# Patient Record
Sex: Male | Born: 2000 | Race: White | Hispanic: No | Marital: Single
Health system: Southern US, Community
[De-identification: ages and names within clinical notes are randomized; demographics above are authoritative.]

---

## 2018-10-03 DIAGNOSIS — Z23 Encounter for immunization: Secondary | ICD-10-CM | POA: Diagnosis not present

## 2019-01-14 DIAGNOSIS — Z029 Encounter for administrative examinations, unspecified: Secondary | ICD-10-CM | POA: Diagnosis not present

## 2019-01-14 DIAGNOSIS — J4520 Mild intermittent asthma, uncomplicated: Secondary | ICD-10-CM | POA: Diagnosis not present

## 2019-01-15 DIAGNOSIS — H9190 Unspecified hearing loss, unspecified ear: Secondary | ICD-10-CM | POA: Diagnosis not present

## 2019-01-15 DIAGNOSIS — H532 Diplopia: Secondary | ICD-10-CM | POA: Diagnosis not present

## 2019-01-15 DIAGNOSIS — J4520 Mild intermittent asthma, uncomplicated: Secondary | ICD-10-CM | POA: Diagnosis not present

## 2019-01-15 DIAGNOSIS — Z0000 Encounter for general adult medical examination without abnormal findings: Secondary | ICD-10-CM | POA: Diagnosis not present

## 2019-03-20 DIAGNOSIS — H00024 Hordeolum internum left upper eyelid: Secondary | ICD-10-CM | POA: Diagnosis not present

## 2019-03-29 ENCOUNTER — Ambulatory Visit: Payer: BC Managed Care – PPO | Attending: Internal Medicine

## 2019-03-29 DIAGNOSIS — Z23 Encounter for immunization: Secondary | ICD-10-CM

## 2019-03-29 NOTE — Progress Notes (Signed)
   Cov[facility] Vaccination [facility]  Name:  Arham Symmonds    MRN: [id number] DOB: 29-Sep-2000  03/29/2019  Mr. Rowland was observed post Covid-19 immunization for 15 minutes without incident. He was provided with Vaccine Information Sheet and instruction to access the V-Safe system.   Mr. Sluka was instructed to call 911 with any severe reactions post vaccine: Marland Kitchen Difficulty breathing  . Swelling of face and throat  . A fast heartbeat  . A bad rash all over body  . Dizziness and weakness   Immunizations Administered    Name Date Dose VIS Date Route   Pfizer COVID-19 Vaccine 03/29/2019  4:25 PM 0.3 mL 12/13/2018 Intramuscular   Manufacturer: [employer], [employer]   Lot: [id number]   NDC: [address]-1

## 2019-04-03 DIAGNOSIS — H0015 Chalazion left lower eyelid: Secondary | ICD-10-CM | POA: Diagnosis not present

## 2019-04-22 ENCOUNTER — Ambulatory Visit: Payer: BC Managed Care – PPO | Attending: Internal Medicine

## 2019-04-22 DIAGNOSIS — Z23 Encounter for immunization: Secondary | ICD-10-CM

## 2019-04-22 NOTE — Progress Notes (Signed)
   Covid-19 Vaccination Clinic  Name:  Joshua Sheppard    MRN: [id number] DOB: 10-13-2000  04/22/2019  [name] was observed post Covid-19 immunization for 30 minutes based on pre-vaccination screening wit dad who has to wait fo 30 mins without incident. He was provided with Vaccine Information Sheet and instruction to access the V-Safe system.   [name] was instructed to call 911 with any severe reactions post vaccine: Marland Kitchen Difficulty breathing  . Swelling of face and throat  . A fast heartbeat  . A bad rash all over body  . Dizziness and weakness   Immunizations Administered    Name Date Dose VIS Date Route   Pfizer COVID-19 Vaccine 04/22/2019  3:21 PM 0.3 mL 02/26/2018 Intramuscular   Manufacturer: [employer], [employer]   Lot: [id number]   NDC: [address]-2

## 2019-08-28 DIAGNOSIS — J029 Acute pharyngitis, unspecified: Secondary | ICD-10-CM | POA: Diagnosis not present

## 2019-08-29 DIAGNOSIS — J029 Acute pharyngitis, unspecified: Secondary | ICD-10-CM | POA: Diagnosis not present

## 2019-12-21 DIAGNOSIS — J351 Hypertrophy of tonsils: Secondary | ICD-10-CM | POA: Diagnosis not present

## 2020-01-14 DIAGNOSIS — R221 Localized swelling, mass and lump, neck: Secondary | ICD-10-CM | POA: Diagnosis not present

## 2020-01-14 DIAGNOSIS — J351 Hypertrophy of tonsils: Secondary | ICD-10-CM | POA: Diagnosis not present

## 2020-01-20 ENCOUNTER — Other Ambulatory Visit: Payer: Self-pay | Admitting: Otolaryngology

## 2020-01-20 DIAGNOSIS — R221 Localized swelling, mass and lump, neck: Secondary | ICD-10-CM

## 2020-01-23 ENCOUNTER — Other Ambulatory Visit: Payer: Self-pay

## 2020-01-23 ENCOUNTER — Ambulatory Visit
Admission: RE | Admit: 2020-01-23 | Discharge: 2020-01-23 | Disposition: A | Discharge status: Home / Self Care | Payer: BC Managed Care – PPO | Source: Ambulatory Visit | Attending: Otolaryngology | Admitting: Otolaryngology

## 2020-01-23 DIAGNOSIS — J341 Cyst and mucocele of nose and nasal sinus: Secondary | ICD-10-CM | POA: Diagnosis not present

## 2020-01-23 DIAGNOSIS — R221 Localized swelling, mass and lump, neck: Secondary | ICD-10-CM

## 2020-01-23 MED ORDER — IOPAMIDOL (ISOVUE-300) INJECTION 61%
75.0000 mL | Freq: Once | INTRAVENOUS | Status: AC | PRN
  Administered 2020-01-23: 75 mL via INTRAVENOUS

## 2020-06-24 DIAGNOSIS — Z13220 Encounter for screening for lipoid disorders: Secondary | ICD-10-CM | POA: Diagnosis not present

## 2020-06-24 DIAGNOSIS — Z0000 Encounter for general adult medical examination without abnormal findings: Secondary | ICD-10-CM | POA: Diagnosis not present

## 2020-06-24 DIAGNOSIS — Z23 Encounter for immunization: Secondary | ICD-10-CM | POA: Diagnosis not present

## 2020-06-24 DIAGNOSIS — J4520 Mild intermittent asthma, uncomplicated: Secondary | ICD-10-CM | POA: Diagnosis not present

## 2020-06-24 DIAGNOSIS — R4586 Emotional lability: Secondary | ICD-10-CM | POA: Diagnosis not present

## 2021-07-22 IMAGING — CT CT NECK W/ CM
3 of 4 series · 6 of 14 positions shown, 7 images · IV contrast (iopamidol)
Comparison: None.

CLINICAL DATA: Submental mass.

EXAM:
CT NECK WITH CONTRAST
TECHNIQUE: Multidetector CT imaging of the neck was performed using the
standard protocol following the bolus administration of intravenous
contrast.
CONTRAST:  75mL [id number]-355 IOPAMIDOL ([id number]-355) INJECTION 61%

[Series 3: neck · axial · 0.53mm/px · z∈[-230,-136]mm · 2 of 143 slices shown, 3 images]
[im 48/143  soft-tissue]
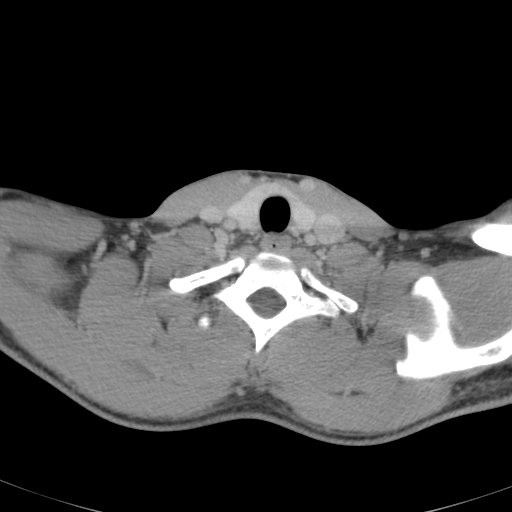
[im 48/143  bone]
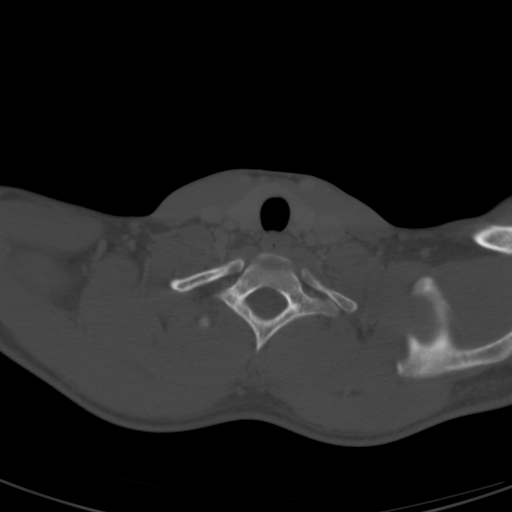
[im 95/143  bone]
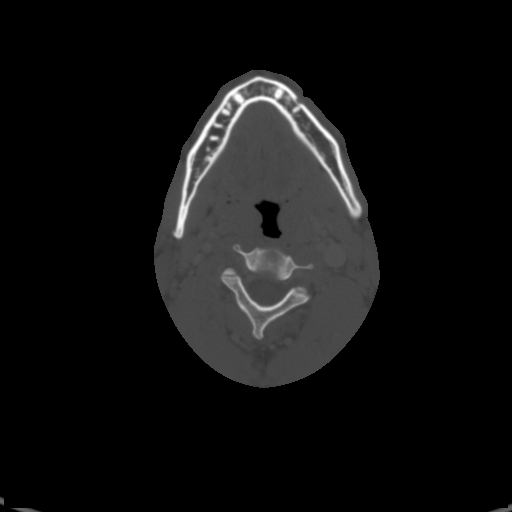

[Series 8: angled axial-oropharynx · axial · 0.39mm/px · z∈[-220,-127]mm · 2 of 143 slices shown]
[im 48/143  bone]
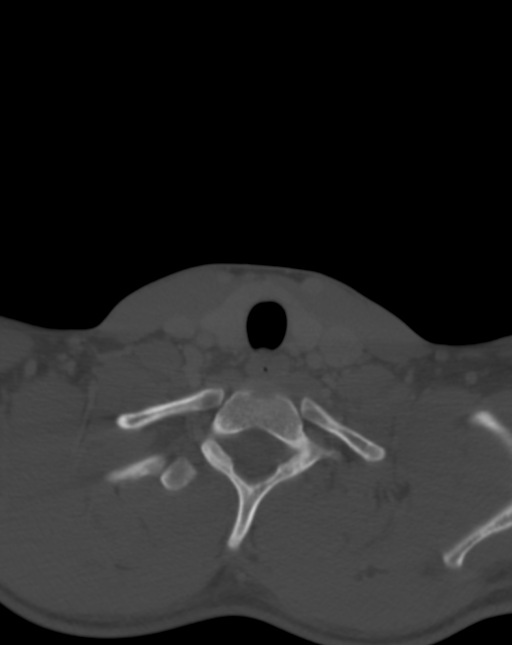
[im 95/143  bone]
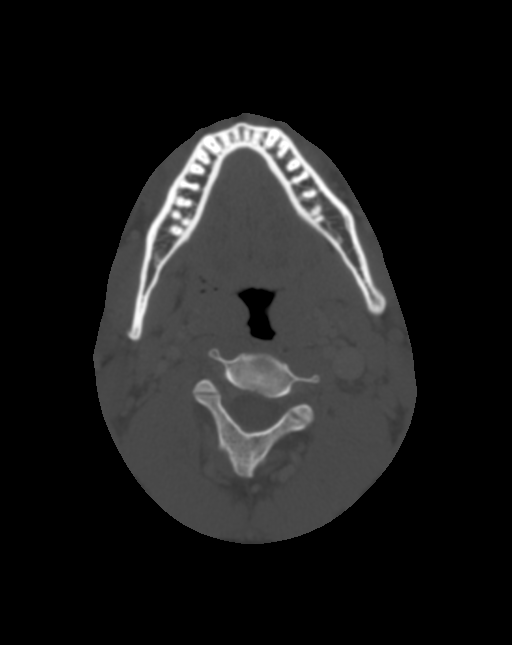

[Series 9: angled (person_name) · axial · 0.39mm/px · z∈[-220,-127]mm · 2 of 143 slices shown]
[im 48/143  bone]
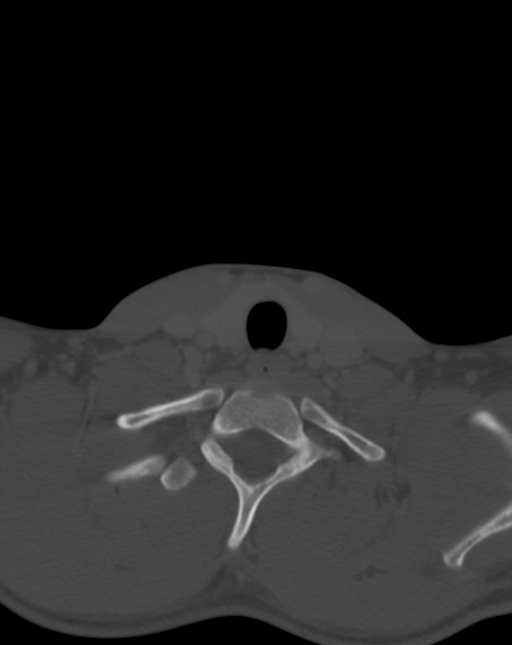
[im 95/143  bone]
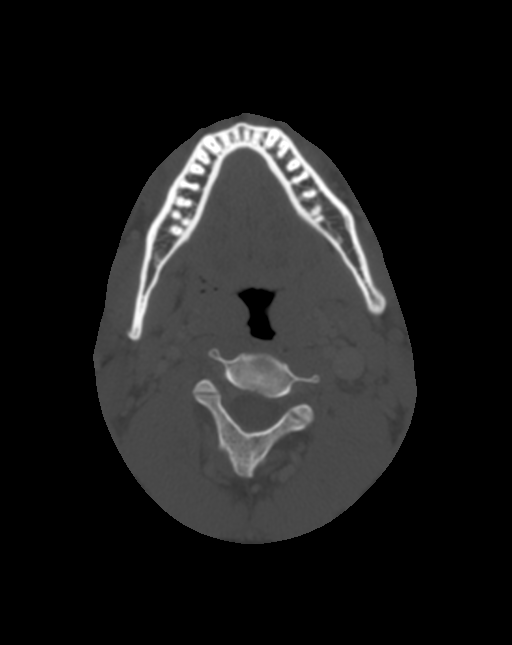

[6 of 14 positions shown; findings below may reference images not displayed]

FINDINGS: Pharynx and larynx: Normal. No mass or swelling.

Salivary glands: No inflammation, mass, or stone.

Thyroid: Normal.

Lymph nodes: None enlarged or abnormal density. In the right
submental region, underneath the skin marker placed at the region of
concern, there is a normal size (5 mm) lymph node.

Vascular: Negative.

Limited intracranial: Negative.

Visualized orbits: Negative.

Mastoids and visualized paranasal sinuses: Small mucous retention
cyst in the left maxillary sinus. Mastoids are clear.

Skeleton: No acute or aggressive process.

Upper chest: Negative.
IMPRESSION: Normal size (5 mm) lymph node in the right submental region.

## 2022-10-02 DIAGNOSIS — G43909 Migraine, unspecified, not intractable, without status migrainosus: Secondary | ICD-10-CM | POA: Diagnosis not present

## 2022-10-02 DIAGNOSIS — Z23 Encounter for immunization: Secondary | ICD-10-CM | POA: Diagnosis not present

## 2022-10-02 DIAGNOSIS — Z0000 Encounter for general adult medical examination without abnormal findings: Secondary | ICD-10-CM | POA: Diagnosis not present

## 2022-10-02 DIAGNOSIS — J4520 Mild intermittent asthma, uncomplicated: Secondary | ICD-10-CM | POA: Diagnosis not present

## 2022-12-14 DIAGNOSIS — G43909 Migraine, unspecified, not intractable, without status migrainosus: Secondary | ICD-10-CM | POA: Diagnosis not present

## 2023-06-17 DIAGNOSIS — R051 Acute cough: Secondary | ICD-10-CM | POA: Diagnosis not present

## 2023-06-17 DIAGNOSIS — J4520 Mild intermittent asthma, uncomplicated: Secondary | ICD-10-CM | POA: Diagnosis not present

## 2023-06-17 DIAGNOSIS — J189 Pneumonia, unspecified organism: Secondary | ICD-10-CM | POA: Diagnosis not present

## 2023-07-08 DIAGNOSIS — J029 Acute pharyngitis, unspecified: Secondary | ICD-10-CM | POA: Diagnosis not present

## 2023-07-08 DIAGNOSIS — R0602 Shortness of breath: Secondary | ICD-10-CM | POA: Diagnosis not present

## 2023-08-01 DIAGNOSIS — Z6824 Body mass index (BMI) 24.0-24.9, adult: Secondary | ICD-10-CM | POA: Diagnosis not present

## 2023-08-01 DIAGNOSIS — J351 Hypertrophy of tonsils: Secondary | ICD-10-CM | POA: Diagnosis not present

## 2023-08-01 DIAGNOSIS — R030 Elevated blood-pressure reading, without diagnosis of hypertension: Secondary | ICD-10-CM | POA: Diagnosis not present
